# Patient Record
Sex: Female | Born: 1965 | Race: White | Hispanic: No | Marital: Single | Smoking: Former smoker
Health system: Southern US, Community
[De-identification: ages and names within clinical notes are randomized; demographics above are authoritative.]

## PROBLEM LIST (undated history)

## (undated) DIAGNOSIS — G43909 Migraine, unspecified, not intractable, without status migrainosus: Secondary | ICD-10-CM

## (undated) DIAGNOSIS — F329 Major depressive disorder, single episode, unspecified: Secondary | ICD-10-CM

## (undated) DIAGNOSIS — K5790 Diverticulosis of intestine, part unspecified, without perforation or abscess without bleeding: Secondary | ICD-10-CM

## (undated) DIAGNOSIS — F32A Depression, unspecified: Secondary | ICD-10-CM

## (undated) HISTORY — DX: Major depressive disorder, single episode, unspecified: F32.9

## (undated) HISTORY — PX: HERNIA REPAIR: SHX51

## (undated) HISTORY — DX: Depression, unspecified: F32.A

## (undated) HISTORY — PX: BREAST BIOPSY: SHX20

---

## 2011-11-17 ENCOUNTER — Other Ambulatory Visit: Payer: Self-pay | Admitting: *Deleted

## 2011-11-17 DIAGNOSIS — R921 Mammographic calcification found on diagnostic imaging of breast: Secondary | ICD-10-CM

## 2011-12-08 ENCOUNTER — Ambulatory Visit
Admission: RE | Admit: 2011-12-08 | Discharge: 2011-12-08 | Disposition: A | Discharge status: Home / Self Care | Payer: Self-pay | Source: Ambulatory Visit | Attending: *Deleted | Admitting: *Deleted

## 2011-12-08 ENCOUNTER — Other Ambulatory Visit: Payer: Self-pay | Admitting: *Deleted

## 2011-12-08 DIAGNOSIS — R921 Mammographic calcification found on diagnostic imaging of breast: Secondary | ICD-10-CM

## 2011-12-17 ENCOUNTER — Ambulatory Visit
Admission: RE | Admit: 2011-12-17 | Discharge: 2011-12-17 | Disposition: A | Discharge status: Home / Self Care | Payer: Self-pay | Source: Ambulatory Visit | Attending: *Deleted | Admitting: *Deleted

## 2011-12-17 ENCOUNTER — Ambulatory Visit: Admission: RE | Admit: 2011-12-17 | Payer: Self-pay | Source: Ambulatory Visit

## 2011-12-17 ENCOUNTER — Other Ambulatory Visit: Payer: Self-pay | Admitting: *Deleted

## 2011-12-17 ENCOUNTER — Other Ambulatory Visit: Payer: Self-pay | Admitting: Diagnostic Radiology

## 2011-12-17 DIAGNOSIS — R921 Mammographic calcification found on diagnostic imaging of breast: Secondary | ICD-10-CM

## 2012-02-12 ENCOUNTER — Ambulatory Visit: Payer: Self-pay | Admitting: Family Medicine

## 2012-02-12 VITALS — BP 110/68 | HR 72 | Temp 97.6°F | Resp 18 | Ht 63.0 in | Wt 101.0 lb

## 2012-02-12 DIAGNOSIS — R112 Nausea with vomiting, unspecified: Secondary | ICD-10-CM

## 2012-02-12 DIAGNOSIS — R197 Diarrhea, unspecified: Secondary | ICD-10-CM

## 2012-02-12 DIAGNOSIS — R109 Unspecified abdominal pain: Secondary | ICD-10-CM

## 2012-02-12 DIAGNOSIS — K2900 Acute gastritis without bleeding: Secondary | ICD-10-CM

## 2012-02-12 LAB — COMPREHENSIVE METABOLIC PANEL
ALT: 12 U/L (ref 0–35)
AST: 21 U/L (ref 0–37)
Albumin: 4.7 g/dL (ref 3.5–5.2)
Alkaline Phosphatase: 32 U/L — ABNORMAL LOW (ref 39–117)
BUN: 7 mg/dL (ref 6–23)
Potassium: 4.2 mEq/L (ref 3.5–5.3)

## 2012-02-12 LAB — POCT CBC
Granulocyte percent: 88.7 %G — AB (ref 37–80)
MCV: 100.1 fL — AB (ref 80–97)
MID (cbc): 0.3 (ref 0–0.9)
MPV: 10.1 fL (ref 0–99.8)
POC Granulocyte: 7.1 — AB (ref 2–6.9)
POC LYMPH PERCENT: 7.8 %L — AB (ref 10–50)
POC MID %: 3.5 %M (ref 0–12)
Platelet Count, POC: 221 10*3/uL (ref 142–424)
RDW, POC: 12.9 %

## 2012-02-12 MED ORDER — ONDANSETRON 4 MG PO TBDP
4.0000 mg | ORAL_TABLET | Freq: Once | ORAL | Status: AC
  Administered 2012-02-12: 4 mg via ORAL

## 2012-02-12 MED ORDER — ONDANSETRON 4 MG PO TBDP: 4.0000 mg | ORAL_TABLET | Freq: Three times a day (TID) | ORAL | Status: AC | PRN

## 2012-02-12 NOTE — Patient Instructions (Addendum)
Drink plenty of non-dairy, non-alchololic beverages.  Tylenol 1-2 every 6 hrs if needed for fever or aching  Zofran for nausea  If diarrhea persisits take Imodium (OTC)  ER or return if worse

## 2012-02-12 NOTE — Progress Notes (Signed)
Subjective: Patient woke up in the night with nausea and vomiting and diarrhea. It is persistent and she is empty. She's not been febrile. She just feels quite sick. She did drink 3 beers and some wine last night, and realizes that may be part of the problem. She has had this problem in the past. She did not eat anything out of the ordinary last night except some lima beans that her neighbor gave her. 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  She took Tylenol for the aching.   Results for orders placed in visit on 02/12/12  POCT CBC      Component Value Range   WBC 8.0  4.6 - 10.2 K/uL   Lymph, poc 0.6  0.6 - 3.4   POC LYMPH PERCENT 7.8 (*) 10 - 50 %L   MID (cbc) 0.3  0 - 0.9   POC MID % 3.5  0 - 12 %M   POC Granulocyte 7.1 (*) 2 - 6.9   Granulocyte percent 88.7 (*) 37 - 80 %G   RBC 4.20  4.04 - 5.48 M/uL   Hemoglobin 13.0  12.2 - 16.2 g/dL   HCT, POC 14.7  82.9 - 47.9 %   MCV 100.1 (*) 80 - 97 fL   MCH, POC 31.0  27 - 31.2 pg   MCHC 31.0 (*) 31.8 - 35.4 g/dL   RDW, POC 56.2     Platelet Count, POC 221  142 - 424 K/uL   MPV 10.1  0 - 99.8 fL    Objective: Ill-appearing female in no major distress. Laying on the exam table her right side. Chest is clear. Heart regular without murmurs. Abdomen had bowel sounds present. Was a little firm until she bent her knees. It's soft but is tender in the epigastrium and left upper quadrant. Tenderness in the midabdomen.  Assessment: Acute gastroenteritis  Plan: I do not feel like she is excessively dehydrated. We'll give her some Zofran for nausea. Encouraged hydration.

## 2013-02-22 ENCOUNTER — Other Ambulatory Visit: Payer: Self-pay | Admitting: *Deleted

## 2013-02-22 DIAGNOSIS — N6012 Diffuse cystic mastopathy of left breast: Secondary | ICD-10-CM

## 2013-03-18 ENCOUNTER — Ambulatory Visit
Admission: RE | Admit: 2013-03-18 | Discharge: 2013-03-18 | Disposition: A | Discharge status: Home / Self Care | Payer: BC Managed Care – PPO | Source: Ambulatory Visit | Attending: *Deleted | Admitting: *Deleted

## 2013-03-18 DIAGNOSIS — N6012 Diffuse cystic mastopathy of left breast: Secondary | ICD-10-CM

## 2013-04-25 ENCOUNTER — Emergency Department (HOSPITAL_COMMUNITY)
Admission: EM | Admit: 2013-04-25 | Discharge: 2013-04-25 | Disposition: A | Discharge status: Home / Self Care | Payer: BC Managed Care – PPO | Attending: Emergency Medicine | Admitting: Emergency Medicine

## 2013-04-25 ENCOUNTER — Encounter (HOSPITAL_COMMUNITY): Payer: Self-pay | Admitting: *Deleted

## 2013-04-25 DIAGNOSIS — K2970 Gastritis, unspecified, without bleeding: Secondary | ICD-10-CM | POA: Insufficient documentation

## 2013-04-25 DIAGNOSIS — Z3202 Encounter for pregnancy test, result negative: Secondary | ICD-10-CM | POA: Insufficient documentation

## 2013-04-25 DIAGNOSIS — Z9889 Other specified postprocedural states: Secondary | ICD-10-CM | POA: Insufficient documentation

## 2013-04-25 DIAGNOSIS — R51 Headache: Secondary | ICD-10-CM | POA: Insufficient documentation

## 2013-04-25 LAB — COMPREHENSIVE METABOLIC PANEL
BUN: 9 mg/dL (ref 6–23)
Calcium: 9.3 mg/dL (ref 8.4–10.5)
GFR calc Af Amer: >90 mL/min (ref >90)
Glucose, Bld: 184 mg/dL — ABNORMAL HIGH (ref 70–99)
Total Protein: 6.6 g/dL (ref 6.0–8.3)

## 2013-04-25 LAB — CBC WITH DIFFERENTIAL/PLATELET
Eosinophils Absolute: 0.2 10*3/uL (ref 0.0–0.7)
Eosinophils Relative: 1 % (ref 0–5)
Hemoglobin: 13.2 g/dL (ref 12.0–15.0)
Lymphs Abs: 1.3 10*3/uL (ref 0.7–4.0)
MCH: 31.4 pg (ref 26.0–34.0)
MCHC: 34.9 g/dL (ref 30.0–36.0)
MCV: 89.8 fL (ref 78.0–100.0)
Monocytes Relative: 7 % (ref 3–12)
Platelets: 175 10*3/uL (ref 150–400)
RBC: 4.21 MIL/uL (ref 3.87–5.11)

## 2013-04-25 LAB — URINALYSIS, ROUTINE W REFLEX MICROSCOPIC
Nitrite: NEGATIVE
Specific Gravity, Urine: 1.019 (ref 1.005–1.030)
Urobilinogen, UA: 0.2 mg/dL (ref 0.0–1.0)

## 2013-04-25 LAB — POCT PREGNANCY, URINE: Preg Test, Ur: NEGATIVE

## 2013-04-25 LAB — LIPASE, BLOOD: Lipase: 31 U/L (ref 11–59)

## 2013-04-25 LAB — URINE MICROSCOPIC-ADD ON

## 2013-04-25 MED ORDER — DICYCLOMINE HCL 20 MG PO TABS: 20.0000 mg | ORAL_TABLET | Freq: Two times a day (BID) | ORAL | Status: DC

## 2013-04-25 MED ORDER — ONDANSETRON HCL 4 MG PO TABS: 4.0000 mg | ORAL_TABLET | Freq: Three times a day (TID) | ORAL | Status: DC | PRN

## 2013-04-25 MED ORDER — HYDROMORPHONE HCL PF 1 MG/ML IJ SOLN
1.0000 mg | Freq: Once | INTRAMUSCULAR | Status: AC
  Administered 2013-04-25: 1 mg via INTRAVENOUS
  Filled 2013-04-25: qty 1

## 2013-04-25 MED ORDER — ONDANSETRON HCL 4 MG/2ML IJ SOLN
4.0000 mg | Freq: Once | INTRAMUSCULAR | Status: AC
  Administered 2013-04-25: 4 mg via INTRAVENOUS
  Filled 2013-04-25: qty 2

## 2013-04-25 NOTE — ED Provider Notes (Signed)
CSN: [id number]     Arrival date & time 04/25/13  4098 History   First MD Initiated Contact with Patient 04/25/13 [phone]     Chief Complaint  Patient presents with  . Abdominal Pain  . Emesis   (Consider location/radiation/quality/duration/timing/severity/associated sxs/prior Treatment) Patient is a 47 y.o. female presenting with abdominal pain and vomiting.  Abdominal Pain Associated symptoms: vomiting   Emesis Associated symptoms: abdominal pain    Pt reports she woke up at 3am with diffuse throbbing headache. Went back to bed and woke up a short while later with diffuse abdominal 'spasm' associated with multiple episodes of vomiting. She had one loose BM but no bloody or melanic stools. She has had similar episodes previously she related to EtOH overuse but states she has not had any alcohol in about 2 weeks. She also reports she is currently on her menses although she had a previous menses less than a month ago. She reports she typically gets headache similar to this during her menses. She states abdominal pain is primarily lower abdomen.   History reviewed. No pertinent past medical history. Past Surgical History  Procedure Laterality Date  . Hernia repair     No family history on file. History  Substance Use Topics  . Smoking status: Former Games developer  . Smokeless tobacco: Not on file  . Alcohol Use: Not on file     Comment: quit 2 weeks ago usually 2 beers per night   OB History   Grav Para Term Preterm Abortions TAB SAB Ect Mult Living                 Review of Systems  Gastrointestinal: Positive for vomiting and abdominal pain.   All other systems reviewed and are negative except as noted in HPI.    Allergies  Review of patient's allergies indicates no known allergies.  Home Medications   Current Outpatient Rx  Name  Route  Sig  Dispense  Refill  . valACYclovir (VALTREX) 500 MG tablet   Oral   Take by mouth as needed.           BP 137/96  Pulse 71  Resp 20   Ht 5\' 3"  (1.6 m)  Wt 106 lb (48.081 kg)  BMI 18.78 kg/m2  SpO2 100%  LMP 04/22/2013 Physical Exam  Nursing note and vitals reviewed. Constitutional: She is oriented to person, place, and time. She appears well-developed and well-nourished.  HENT:  Head: Normocephalic and atraumatic.  Eyes: EOM are normal. Pupils are equal, round, and reactive to light.  Neck: Normal range of motion. Neck supple.  Cardiovascular: Normal rate, normal heart sounds and intact distal pulses.   Pulmonary/Chest: Effort normal and breath sounds normal.  Abdominal: Bowel sounds are normal. She exhibits no distension. There is tenderness (suprapubic, no RUQ or RLQ tenderness). There is no rebound and no guarding.  Musculoskeletal: Normal range of motion. She exhibits no edema and no tenderness.  Neurological: She is alert and oriented to person, place, and time. She has normal strength. No cranial nerve deficit or sensory deficit.  Skin: Skin is warm and dry. No rash noted.  Psychiatric: She has a normal mood and affect.    ED Course  Procedures (including critical care time) Labs Review Labs Reviewed  CBC WITH DIFFERENTIAL - Abnormal; Notable for the following:    WBC 16.8 (*)    Neutrophils Relative % 84 (*)    Neutro Abs 14.1 (*)    Lymphocytes Relative 8 (*)  Monocytes Absolute 1.2 (*)    All other components within normal limits  COMPREHENSIVE METABOLIC PANEL - Abnormal; Notable for the following:    Glucose, Bld 184 (*)    Total Bilirubin 0.2 (*)    All other components within normal limits  URINALYSIS, ROUTINE W REFLEX MICROSCOPIC - Abnormal; Notable for the following:    APPearance CLOUDY (*)    Hgb urine dipstick LARGE (*)    All other components within normal limits  URINE MICROSCOPIC-ADD ON - Abnormal; Notable for the following:    Bacteria, UA FEW (*)    All other components within normal limits  LIPASE, BLOOD  POCT PREGNANCY, URINE   Imaging Review No results found.  MDM   1.  Gastritis   2. Headache     Pt feeling much better, no further abdominal cramping or headache. She states she had some improperly stored food yesterday which may have contributed to her symptoms. No concern for surgical process in abdomen. Abdomen is benign. Pain and vomiting controlled. Advised GI followup if persists. Bentyl and Zofran for symptom control. Her headache is non-specific as well. Doubt this is acute intracranial process. No meningismus, normal neuro exam and symptoms resolved. She has had similar headaches with menses in the past. Not the worst headache of her life.     Zamariya Neal B. Bernette Mayers, MD 04/25/13 [phone]

## 2013-04-25 NOTE — ED Notes (Signed)
Pt states that she woke up this am around 4am with diffuse abd pain and vomiting; pt states that it came on all of sudden; pt states that she had a loose BM but denies diarrhea, pt also c/o headache

## 2013-04-25 NOTE — ED Notes (Signed)
Pt states she's having her second menstrual cycle this month, states she thinks it's from menopause. Pt states around 3:30 am this morning started having lower abdominal cramping, states it comes and goes, pt also having nausea w/ dry heaves.

## 2014-03-28 ENCOUNTER — Ambulatory Visit (INDEPENDENT_AMBULATORY_CARE_PROVIDER_SITE_OTHER): Payer: BC Managed Care – PPO

## 2014-03-28 ENCOUNTER — Ambulatory Visit (HOSPITAL_COMMUNITY)
Admission: RE | Admit: 2014-03-28 | Discharge: 2014-03-28 | Disposition: A | Discharge status: Home / Self Care | Payer: BC Managed Care – PPO | Source: Ambulatory Visit | Attending: Family Medicine | Admitting: Family Medicine

## 2014-03-28 ENCOUNTER — Ambulatory Visit (INDEPENDENT_AMBULATORY_CARE_PROVIDER_SITE_OTHER): Payer: BC Managed Care – PPO | Admitting: Family Medicine

## 2014-03-28 VITALS — BP 116/68 | HR 96 | Temp 98.7°F | Resp 17 | Ht 63.0 in | Wt 110.0 lb

## 2014-03-28 DIAGNOSIS — D72829 Elevated white blood cell count, unspecified: Secondary | ICD-10-CM

## 2014-03-28 DIAGNOSIS — R1031 Right lower quadrant pain: Secondary | ICD-10-CM

## 2014-03-28 DIAGNOSIS — R109 Unspecified abdominal pain: Secondary | ICD-10-CM

## 2014-03-28 DIAGNOSIS — N83209 Unspecified ovarian cyst, unspecified side: Secondary | ICD-10-CM | POA: Insufficient documentation

## 2014-03-28 DIAGNOSIS — R1032 Left lower quadrant pain: Secondary | ICD-10-CM

## 2014-03-28 DIAGNOSIS — K5732 Diverticulitis of large intestine without perforation or abscess without bleeding: Secondary | ICD-10-CM | POA: Diagnosis not present

## 2014-03-28 DIAGNOSIS — R188 Other ascites: Secondary | ICD-10-CM | POA: Insufficient documentation

## 2014-03-28 LAB — POCT URINALYSIS DIPSTICK
BILIRUBIN UA: NEGATIVE
GLUCOSE UA: NEGATIVE
Leukocytes, UA: NEGATIVE
Nitrite, UA: NEGATIVE
PH UA: 7
Protein, UA: NEGATIVE
RBC UA: NEGATIVE
SPEC GRAV UA: 1.015
Urobilinogen, UA: 0.2

## 2014-03-28 LAB — POCT CBC
GRANULOCYTE PERCENT: 85.2 % — AB (ref 37–80)
HEMATOCRIT: 40.3 % (ref 37.7–47.9)
HEMOGLOBIN: 13 g/dL (ref 12.2–16.2)
LYMPH, POC: 1.2 (ref 0.6–3.4)
MCH, POC: 29.2 pg (ref 27–31.2)
MCHC: 32.3 g/dL (ref 31.8–35.4)
MCV: 90.6 fL (ref 80–97)
MID (cbc): 1.1 — AB (ref 0–0.9)
MPV: 8.1 fL (ref 0–99.8)
POC GRANULOCYTE: 12.8 — AB (ref 2–6.9)
POC LYMPH %: 7.7 % — AB (ref 10–50)
POC MID %: 7.1 %M (ref 0–12)
Platelet Count, POC: 200 10*3/uL (ref 142–424)
RBC: 4.45 M/uL (ref 4.04–5.48)
RDW, POC: 14.4 %
WBC: 15 10*3/uL — AB (ref 4.6–10.2)

## 2014-03-28 LAB — POCT UA - MICROSCOPIC ONLY
CRYSTALS, UR, HPF, POC: NEGATIVE
EPITHELIAL CELLS, URINE PER MICROSCOPY: NEGATIVE
MUCUS UA: NEGATIVE
WBC, UR, HPF, POC: NEGATIVE

## 2014-03-28 MED ORDER — METRONIDAZOLE 500 MG PO TABS: 500.0000 mg | ORAL_TABLET | Freq: Three times a day (TID) | ORAL | Status: DC

## 2014-03-28 MED ORDER — IOHEXOL 300 MG/ML  SOLN
80.0000 mL | Freq: Once | INTRAMUSCULAR | Status: AC | PRN
  Administered 2014-03-28: 80 mL via INTRAVENOUS

## 2014-03-28 MED ORDER — IOHEXOL 300 MG/ML  SOLN
50.0000 mL | Freq: Once | INTRAMUSCULAR | Status: AC | PRN
  Administered 2014-03-28: 50 mL via ORAL

## 2014-03-28 MED ORDER — CIPROFLOXACIN HCL 500 MG PO TABS: 500.0000 mg | ORAL_TABLET | Freq: Two times a day (BID) | ORAL | Status: DC

## 2014-03-28 NOTE — Progress Notes (Signed)
Subjective: 48 year old lady who is here with a history of having abdominal pain since Sunday evening. [place] She drank a lot of water. Yesterday she was hurting and she just. The pain is across the lower abdomen. No nausea or vomiting. She did have a little nocturia last night, 3 times. She has a pressure on her urethra since, but no dysuria or hematuria like she had with UTIs when she was younger. She does not consider some risk of STDs currently. Her menstrual cycles are changing. She's had 2 in the last month, is just finishing an 8 or 9 day stretch of bleeding. That is unusual for her. She had a colonoscopy less than a year ago and was told she might have a tendency toward diverticulitis. She has not had any diverticulitis. I did take out some polyps at that time. She has not had any abdominal surgeries except for hernia when she was a child. She felt a little chilled, has not taken her temperature. Does not think it appear in this started she said her skin was tender all over like she had a flu. She's been on progesterone. She's not seeing a gynecologist for a while, feels like she needs to get to 1 sometime. She is not on any other regular medicines. Is generally healthy lady. Contrast take care of herself.  Objective: Pleasant alert lady who has some discomfort in her abdomen. Her throat looks clear and mucous membranes moist. Neck supple without significant nodes. Chest is clear to auscultation. Heart regular without murmurs. Abdomen had diminished but present bowel sounds. It is flat. Tender to percussion and palpation. Hurts across the low abdomen. No good specific point tenderness skin warm and dry with good skin turgor.  Assessment: Generalized lower abdominal pain Nocturia  Plan: CBC, urinalysis, 2 view abdomen  [facility] reading (PRIMARY) by  Dr. Alwyn RenHopper Nonspecific stool gas pattern  Results for orders placed in visit on 03/28/14  POCT CBC      Result Value Ref  Range   WBC 15.0 (*) 4.6 - 10.2 K/uL   Lymph, poc 1.2  0.6 - 3.4   POC LYMPH PERCENT 7.7 (*) 10 - 50 %L   MID (cbc) 1.1 (*) 0 - 0.9   POC MID % 7.1  0 - 12 %M   POC Granulocyte 12.8 (*) 2 - 6.9   Granulocyte percent 85.2 (*) 37 - 80 %G   RBC 4.45  4.04 - 5.48 M/uL   Hemoglobin 13.0  12.2 - 16.2 g/dL   HCT, POC 03.440.3  74.237.7 - 47.9 %   MCV 90.6  80 - 97 fL   MCH, POC 29.2  27 - 31.2 pg   MCHC 32.3  31.8 - 35.4 g/dL   RDW, POC 59.514.4     Platelet Count, POC 200  142 - 424 K/uL   MPV 8.1  0 - 99.8 fL  POCT UA - MICROSCOPIC ONLY      Result Value Ref Range   WBC, Ur, HPF, POC neg     RBC, urine, microscopic 0-1     Bacteria, U Microscopic small     Mucus, UA neg     Epithelial cells, urine per micros neg     Crystals, Ur, HPF, POC neg     Casts, Ur, LPF, POC waxy     Yeast, UA       Renal tubular cells      POCT URINALYSIS DIPSTICK  Result Value Ref Range   Color, UA yellow     Clarity, UA clear     Glucose, UA neg     Bilirubin, UA neg     Ketones, UA trace     Spec Grav, UA 1.015     Blood, UA neg     pH, UA 7.0     Protein, UA neg     Urobilinogen, UA 0.2     Nitrite, UA neg     Leukocytes, UA Negative     .  We'll go ahead and get a stat CT scan of the abdomen and pelvis due to the leukocytosis and low abdominal pain etiology unknown. Rule out appendicitis, diverticulitis, and ovarian pathology  CT scan is consistent with diverticulitis. Will spec treat with Cipro and Flagyl. Spoke to patient on the phone from x-ray and explained to her. She will start the medication tonight. She is to return or go to the emergency room in the event of any worsening. If pain continues on will need to get her back to the gastroenterologist.

## 2014-03-28 NOTE — Patient Instructions (Signed)
Referral is being made for the stat CT scan of the abdomen and pelvis.  Do not believe x-ray and tell you here from us regarding what we have found and what you should do.

## 2014-03-29 LAB — GC/CHLAMYDIA PROBE AMP
CT Probe RNA: NEGATIVE
GC PROBE AMP APTIMA: NEGATIVE

## 2016-01-30 ENCOUNTER — Other Ambulatory Visit: Payer: Self-pay

## 2016-01-30 ENCOUNTER — Emergency Department (HOSPITAL_COMMUNITY): Payer: Self-pay

## 2016-01-30 ENCOUNTER — Emergency Department (HOSPITAL_COMMUNITY)
Admission: EM | Admit: 2016-01-30 | Discharge: 2016-01-30 | Disposition: A | Discharge status: Home / Self Care | Payer: Self-pay | Attending: Emergency Medicine | Admitting: Emergency Medicine

## 2016-01-30 ENCOUNTER — Encounter (HOSPITAL_COMMUNITY): Payer: Self-pay | Admitting: Emergency Medicine

## 2016-01-30 DIAGNOSIS — S8002XA Contusion of left knee, initial encounter: Secondary | ICD-10-CM | POA: Insufficient documentation

## 2016-01-30 DIAGNOSIS — S20211A Contusion of right front wall of thorax, initial encounter: Secondary | ICD-10-CM | POA: Insufficient documentation

## 2016-01-30 DIAGNOSIS — Y92410 Unspecified street and highway as the place of occurrence of the external cause: Secondary | ICD-10-CM | POA: Insufficient documentation

## 2016-01-30 DIAGNOSIS — S20212A Contusion of left front wall of thorax, initial encounter: Secondary | ICD-10-CM | POA: Insufficient documentation

## 2016-01-30 DIAGNOSIS — S20219A Contusion of unspecified front wall of thorax, initial encounter: Secondary | ICD-10-CM

## 2016-01-30 DIAGNOSIS — Y999 Unspecified external cause status: Secondary | ICD-10-CM | POA: Insufficient documentation

## 2016-01-30 DIAGNOSIS — F329 Major depressive disorder, single episode, unspecified: Secondary | ICD-10-CM | POA: Insufficient documentation

## 2016-01-30 DIAGNOSIS — Y9389 Activity, other specified: Secondary | ICD-10-CM | POA: Insufficient documentation

## 2016-01-30 DIAGNOSIS — Z79899 Other long term (current) drug therapy: Secondary | ICD-10-CM | POA: Insufficient documentation

## 2016-01-30 DIAGNOSIS — Z87891 Personal history of nicotine dependence: Secondary | ICD-10-CM | POA: Insufficient documentation

## 2016-01-30 DIAGNOSIS — M542 Cervicalgia: Secondary | ICD-10-CM | POA: Insufficient documentation

## 2016-01-30 HISTORY — DX: Diverticulosis of intestine, part unspecified, without perforation or abscess without bleeding: K57.90

## 2016-01-30 HISTORY — DX: Migraine, unspecified, not intractable, without status migrainosus: G43.909

## 2016-01-30 MED ORDER — TRAMADOL HCL 50 MG PO TABS
50.0000 mg | ORAL_TABLET | Freq: Once | ORAL | Status: DC
  Filled 2016-01-30: qty 1

## 2016-01-30 MED ORDER — BACITRACIN ZINC 500 UNIT/GM EX OINT
TOPICAL_OINTMENT | CUTANEOUS | Status: AC
  Filled 2016-01-30: qty 0.9

## 2016-01-30 MED ORDER — TRAMADOL HCL 50 MG PO TABS: 50.0000 mg | ORAL_TABLET | Freq: Two times a day (BID) | ORAL | Status: AC | PRN

## 2016-01-30 MED ORDER — IBUPROFEN 400 MG PO TABS: 400.0000 mg | ORAL_TABLET | Freq: Four times a day (QID) | ORAL | Status: AC | PRN

## 2016-01-30 MED ORDER — CYCLOBENZAPRINE HCL 10 MG PO TABS
5.0000 mg | ORAL_TABLET | Freq: Once | ORAL | Status: AC
  Administered 2016-01-30: 5 mg via ORAL
  Filled 2016-01-30: qty 1

## 2016-01-30 MED ORDER — CYCLOBENZAPRINE HCL 5 MG PO TABS: 5.0000 mg | ORAL_TABLET | Freq: Three times a day (TID) | ORAL | Status: AC | PRN

## 2016-01-30 NOTE — Discharge Instructions (Signed)
Contusion A contusion is a deep bruise. Contusions happen when an injury causes bleeding under the skin. Symptoms of bruising include pain, swelling, and discolored skin. The skin may turn blue, purple, or yellow. HOME CARE   Rest the injured area.  If told, put ice on the injured area.  Put ice in a plastic bag.  Place a towel between your skin and the bag.  Leave the ice on for 20 minutes, 2-3 times per day.  If told, put light pressure (compression) on the injured area using an elastic bandage. Make sure the bandage is not too tight. Remove it and put it back on as told by your doctor.  If possible, raise (elevate) the injured area above the level of your heart while you are sitting or lying down.  Take over-the-counter and prescription medicines only as told by your doctor. GET HELP IF:  Your symptoms do not get better after several days of treatment.  Your symptoms get worse.  You have trouble moving the injured area. GET HELP RIGHT AWAY IF:   You have very bad pain.  You have a loss of feeling (numbness) in a hand or foot.  Your hand or foot turns pale or cold.   This information is not intended to replace advice given to you by your health care provider. Make sure you discuss any questions you have with your health care provider.   Document Released: 01/14/2008 Document Revised: 04/18/2015 Document Reviewed: 12/13/2014 Elsevier Interactive Patient Education 2016 Elsevier Inc[employer]  Chest Contusion A contusion is a deep bruise. Bruises happen when an injury causes bleeding under the skin. Signs of bruising include pain, puffiness (swelling), and discolored skin. The bruise may turn blue, purple, or yellow.  HOME CARE  Put ice on the injured area.  Put ice in a plastic bag.  Place a towel between the skin and the bag.  Leave the ice on for 15-20 minutes at a time, 03-04 times a day for the first 48 hours.  Only take medicine as told by your doctor.  Rest.  Take  deep breaths (deep-breathing exercises) as told by your doctor.  Stop smoking if you smoke.  Do not lift objects over 5 pounds (2.3 kilograms) for 3 days or longer if told by your doctor. GET HELP RIGHT AWAY IF:   You have more bruising or puffiness.  You have pain that gets worse.  You have trouble breathing.  You are dizzy, weak, or pass out (faint).  You have blood in your pee (urine) or poop (stool).  You cough up or throw up (vomit) blood.  Your puffiness or pain is not helped with medicines. MAKE SURE YOU:   Understand these instructions.  Will watch your condition.  Will get help right away if you are not doing well or get worse.   This information is not intended to replace advice given to you by your health care provider. Make sure you discuss any questions you have with your health care provider.   Document Released: 01/14/2008 Document Revised: 04/21/2012 Document Reviewed: 01/19/2012 Elsevier Interactive Patient Education [employer]2016 Elsevier Inc.
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
